# Patient Record
Sex: Male | Born: 1935
Health system: Southern US, Community
[De-identification: ages and names within clinical notes are randomized; demographics above are authoritative.]

## PROBLEM LIST (undated history)

## (undated) DIAGNOSIS — M109 Gout, unspecified: Secondary | ICD-10-CM

## (undated) DIAGNOSIS — I10 Essential (primary) hypertension: Secondary | ICD-10-CM

## (undated) DIAGNOSIS — I2510 Atherosclerotic heart disease of native coronary artery without angina pectoris: Secondary | ICD-10-CM

## (undated) DIAGNOSIS — K219 Gastro-esophageal reflux disease without esophagitis: Secondary | ICD-10-CM

## (undated) DIAGNOSIS — M419 Scoliosis, unspecified: Secondary | ICD-10-CM

## (undated) DIAGNOSIS — E079 Disorder of thyroid, unspecified: Secondary | ICD-10-CM

## (undated) DIAGNOSIS — I739 Peripheral vascular disease, unspecified: Secondary | ICD-10-CM

## (undated) DIAGNOSIS — J449 Chronic obstructive pulmonary disease, unspecified: Secondary | ICD-10-CM

## (undated) DIAGNOSIS — K5792 Diverticulitis of intestine, part unspecified, without perforation or abscess without bleeding: Secondary | ICD-10-CM

## (undated) DIAGNOSIS — M1990 Unspecified osteoarthritis, unspecified site: Secondary | ICD-10-CM

## (undated) DIAGNOSIS — C801 Malignant (primary) neoplasm, unspecified: Secondary | ICD-10-CM

## (undated) DIAGNOSIS — D649 Anemia, unspecified: Secondary | ICD-10-CM

## (undated) DIAGNOSIS — N289 Disorder of kidney and ureter, unspecified: Secondary | ICD-10-CM

## (undated) DIAGNOSIS — M4800 Spinal stenosis, site unspecified: Secondary | ICD-10-CM

## (undated) DIAGNOSIS — I509 Heart failure, unspecified: Secondary | ICD-10-CM

## (undated) DIAGNOSIS — Z992 Dependence on renal dialysis: Secondary | ICD-10-CM

## (undated) HISTORY — PX: CARDIAC SURGERY: SHX584

## (undated) HISTORY — PX: CARDIAC DEFIBRILLATOR PLACEMENT: SHX171

---

## 2016-07-04 ENCOUNTER — Emergency Department (HOSPITAL_BASED_OUTPATIENT_CLINIC_OR_DEPARTMENT_OTHER)
Admission: EM | Admit: 2016-07-04 | Discharge: 2016-07-04 | Disposition: A | Discharge status: Home / Self Care | Payer: Medicare HMO | Attending: Emergency Medicine | Admitting: Emergency Medicine

## 2016-07-04 ENCOUNTER — Emergency Department (HOSPITAL_BASED_OUTPATIENT_CLINIC_OR_DEPARTMENT_OTHER): Payer: Medicare HMO

## 2016-07-04 ENCOUNTER — Encounter (HOSPITAL_BASED_OUTPATIENT_CLINIC_OR_DEPARTMENT_OTHER): Payer: Self-pay | Admitting: *Deleted

## 2016-07-04 DIAGNOSIS — R05 Cough: Secondary | ICD-10-CM | POA: Diagnosis present

## 2016-07-04 DIAGNOSIS — Z792 Long term (current) use of antibiotics: Secondary | ICD-10-CM | POA: Diagnosis not present

## 2016-07-04 DIAGNOSIS — N186 End stage renal disease: Secondary | ICD-10-CM | POA: Diagnosis not present

## 2016-07-04 DIAGNOSIS — J189 Pneumonia, unspecified organism: Secondary | ICD-10-CM | POA: Insufficient documentation

## 2016-07-04 DIAGNOSIS — Z87891 Personal history of nicotine dependence: Secondary | ICD-10-CM | POA: Diagnosis not present

## 2016-07-04 DIAGNOSIS — J449 Chronic obstructive pulmonary disease, unspecified: Secondary | ICD-10-CM | POA: Diagnosis not present

## 2016-07-04 DIAGNOSIS — I120 Hypertensive chronic kidney disease with stage 5 chronic kidney disease or end stage renal disease: Secondary | ICD-10-CM | POA: Insufficient documentation

## 2016-07-04 DIAGNOSIS — Z992 Dependence on renal dialysis: Secondary | ICD-10-CM | POA: Insufficient documentation

## 2016-07-04 HISTORY — DX: Disorder of kidney and ureter, unspecified: N28.9

## 2016-07-04 HISTORY — DX: Scoliosis, unspecified: M41.9

## 2016-07-04 HISTORY — DX: Chronic obstructive pulmonary disease, unspecified: J44.9

## 2016-07-04 HISTORY — DX: Anemia, unspecified: D64.9

## 2016-07-04 HISTORY — DX: Diverticulitis of intestine, part unspecified, without perforation or abscess without bleeding: K57.92

## 2016-07-04 HISTORY — DX: Heart failure, unspecified: I50.9

## 2016-07-04 HISTORY — DX: Spinal stenosis, site unspecified: M48.00

## 2016-07-04 HISTORY — DX: Dependence on renal dialysis: Z99.2

## 2016-07-04 HISTORY — DX: Gout, unspecified: M10.9

## 2016-07-04 HISTORY — DX: Gastro-esophageal reflux disease without esophagitis: K21.9

## 2016-07-04 HISTORY — DX: Essential (primary) hypertension: I10

## 2016-07-04 HISTORY — DX: Malignant (primary) neoplasm, unspecified: C80.1

## 2016-07-04 HISTORY — DX: Peripheral vascular disease, unspecified: I73.9

## 2016-07-04 HISTORY — DX: Disorder of thyroid, unspecified: E07.9

## 2016-07-04 HISTORY — DX: Unspecified osteoarthritis, unspecified site: M19.90

## 2016-07-04 HISTORY — DX: Atherosclerotic heart disease of native coronary artery without angina pectoris: I25.10

## 2016-07-04 LAB — CBC WITH DIFFERENTIAL/PLATELET
BASOS ABS: 0 10*3/uL (ref 0.0–0.1)
Basophils Relative: 0 %
EOS ABS: 1.5 10*3/uL — AB (ref 0.0–0.7)
Eosinophils Relative: 24 %
HEMATOCRIT: 36.5 % — AB (ref 39.0–52.0)
HEMOGLOBIN: 11.7 g/dL — AB (ref 13.0–17.0)
LYMPHS PCT: 9 %
Lymphs Abs: 0.6 10*3/uL — ABNORMAL LOW (ref 0.7–4.0)
MCH: 29.2 pg (ref 26.0–34.0)
MCHC: 32.1 g/dL (ref 30.0–36.0)
MCV: 91 fL (ref 78.0–100.0)
MONOS PCT: 6 %
Monocytes Absolute: 0.4 10*3/uL (ref 0.1–1.0)
NEUTROS ABS: 3.9 10*3/uL (ref 1.7–7.7)
NEUTROS PCT: 61 %
Platelets: 100 10*3/uL — ABNORMAL LOW (ref 150–400)
RBC: 4.01 MIL/uL — AB (ref 4.22–5.81)
RDW: 14.9 % (ref 11.5–15.5)
WBC: 6.4 10*3/uL (ref 4.0–10.5)

## 2016-07-04 LAB — BASIC METABOLIC PANEL
ANION GAP: 7 (ref 5–15)
BUN: 11 mg/dL (ref 6–20)
CALCIUM: 8.5 mg/dL — AB (ref 8.9–10.3)
CHLORIDE: 100 mmol/L — AB (ref 101–111)
CO2: 30 mmol/L (ref 22–32)
CREATININE: 3.04 mg/dL — AB (ref 0.61–1.24)
GFR calc non Af Amer: 18 mL/min — ABNORMAL LOW (ref >60)
GFR, EST AFRICAN AMERICAN: 21 mL/min — AB (ref >60)
Glucose, Bld: 114 mg/dL — ABNORMAL HIGH (ref 65–99)
Potassium: 3.4 mmol/L — ABNORMAL LOW (ref 3.5–5.1)
SODIUM: 137 mmol/L (ref 135–145)

## 2016-07-04 LAB — TROPONIN I: TROPONIN I: 0.1 ng/mL — AB (ref <0.03)

## 2016-07-04 LAB — I-STAT CG4 LACTIC ACID, ED: LACTIC ACID, VENOUS: 1.23 mmol/L (ref 0.5–1.9)

## 2016-07-04 MED ORDER — LEVOFLOXACIN 500 MG PO TABS: 500.0000 mg | ORAL_TABLET | ORAL | 0 refills | Status: AC

## 2016-07-04 MED ORDER — LEVOFLOXACIN 750 MG PO TABS
750.0000 mg | ORAL_TABLET | Freq: Once | ORAL | Status: AC
  Administered 2016-07-04: 750 mg via ORAL
  Filled 2016-07-04: qty 1

## 2016-07-04 MED ORDER — IPRATROPIUM BROMIDE 0.02 % IN SOLN
0.5000 mg | Freq: Once | RESPIRATORY_TRACT | Status: AC
  Administered 2016-07-04: 0.5 mg via RESPIRATORY_TRACT
  Filled 2016-07-04: qty 2.5

## 2016-07-04 MED ORDER — ALBUTEROL SULFATE (2.5 MG/3ML) 0.083% IN NEBU
5.0000 mg | INHALATION_SOLUTION | Freq: Once | RESPIRATORY_TRACT | Status: AC
  Administered 2016-07-04: 5 mg via RESPIRATORY_TRACT
  Filled 2016-07-04: qty 6

## 2016-07-04 MED FILL — levoFLOXacin 500 MG TABS: 500 | 14 days supply | Qty: 7 | Fill #0

## 2016-07-04 NOTE — ED Triage Notes (Signed)
Cough with green sputum for a week.

## 2016-07-04 NOTE — ED Provider Notes (Addendum)
[facility] DEPT [facility] Provider Note   CSN: [id number] Arrival date & time: 07/04/16  1124  First Provider Contact:  First MD Initiated Contact with Patient 07/04/16 1129        History   Chief Complaint Chief Complaint  Patient presents with  . Cough    HPI Micheal Aguilar is a 82 y.o. male.  The history is provided by the patient and the spouse.  Cough  This is a new problem. Episode onset: Proximally 1 week. The problem occurs constantly. The problem has not changed since onset.The cough is productive of blood-tinged sputum and productive of purulent sputum. There has been no fever. Associated symptoms include rhinorrhea, myalgias, shortness of breath and wheezing. Pertinent negatives include no chest pain. Associated symptoms comments: Decreased appetite and chest tightness. Treatments tried: Albuterol inhaler. The treatment provided no relief. He is not a smoker. His past medical history is significant for COPD. Past medical history comments: End-stage renal disease on dialysis last dialyzed today.    Past Medical History:  Diagnosis Date  . COPD (chronic obstructive pulmonary disease) ([place])   . Dialysis patient ([place])   . Hypertension     There are no active problems to display for this patient.   History reviewed. No pertinent surgical history.     Home Medications    Prior to Admission medications   Medication Sig Start Date End Date Taking? Authorizing Provider  clindamycin (CLEOCIN) 150 MG capsule Take by mouth 3 (three) times daily.   Yes Historical Provider, MD  digoxin (LANOXIN) 0.125 MG tablet Take 0.125 mg by mouth as directed. ONE TABLET ON Monday, Wednesday, AND Friday.   Yes Historical Provider, MD    Family History No family history on file.  Social History Social History  Substance Use Topics  . Smoking status: Former Smoker    Types: Cigarettes  . Smokeless tobacco: Never Used  . Alcohol use No     Allergies   Review of patient's  allergies indicates no known allergies.   Review of Systems Review of Systems  HENT: Positive for rhinorrhea.   Respiratory: Positive for cough, shortness of breath and wheezing.   Cardiovascular: Negative for chest pain.  Musculoskeletal: Positive for myalgias.     Physical Exam Updated Vital Signs BP 134/68 (BP Location: Right Arm)   Pulse 62   Temp 97.5 F (36.4 C) (Oral)   Resp 18   Ht 5\' 6"  (1.676 m)   Wt 115 lb (52.2 kg)   SpO2 100%   BMI 18.56 kg/m   Physical Exam  Constitutional: He is oriented to person, place, and time. He appears well-developed. He appears cachectic. No distress.  HENT:  Head: Normocephalic and atraumatic.  Mouth/Throat: Oropharynx is clear and moist.  Eyes: Conjunctivae and EOM are normal. Pupils are equal, round, and reactive to light.  Neck: Normal range of motion. Neck supple.  Cardiovascular: Normal rate, regular rhythm and intact distal pulses.   No murmur heard. Pulmonary/Chest: Effort normal. No respiratory distress. He has wheezes in the right upper field, the right middle field and the right lower field. He has no rales.  Pacemaker present in the left upper chest  Abdominal: Soft. He exhibits no distension. There is no tenderness. There is no rebound and no guarding.  Musculoskeletal: Normal range of motion. He exhibits no edema or tenderness.  Neurological: He is alert and oriented to person, place, and time.  Skin: Skin is warm and dry. No rash noted. No erythema.  Psychiatric:  He has a normal mood and affect. His behavior is normal.  Nursing note and vitals reviewed.    ED Treatments / Results  Labs (all labs ordered are listed, but only abnormal results are displayed) Labs Reviewed  CBC WITH DIFFERENTIAL/PLATELET - Abnormal; Notable for the following:       Result Value   RBC 4.01 (*)    Hemoglobin 11.7 (*)    HCT 36.5 (*)    Platelets 100 (*)    Lymphs Abs 0.6 (*)    Eosinophils Absolute 1.5 (*)    All other  components within normal limits  BASIC METABOLIC PANEL - Abnormal; Notable for the following:    Potassium 3.4 (*)    Chloride 100 (*)    Glucose, Bld 114 (*)    Creatinine, Ser 3.04 (*)    Calcium 8.5 (*)    GFR calc non Af Amer 18 (*)    GFR calc Af Amer 21 (*)    All other components within normal limits  TROPONIN I - Abnormal; Notable for the following:    Troponin I 0.10 (*)    All other components within normal limits  I-STAT CG4 LACTIC ACID, ED    EKG  EKG Interpretation  Date/Time:  Thursday July 04 2016 11:51:02 EDT Ventricular Rate:  61 PR Interval:    QRS Duration: 99 QT Interval:  449 QTC Calculation: 453 R Axis:   34 Text Interpretation:  Sinus rhythm Probable LVH with secondary repol abnrm No previous tracing Confirmed by Maryan Rued  MD, Radiah Lubinski ([id number]) on 07/04/2016 11:58:01 AM       Radiology Dg Chest 2 View  Result Date: 07/04/2016 CLINICAL DATA:  Shortness of breath, wheezing, cough. EXAM: CHEST  2 VIEW COMPARISON:  07/18/2015 FINDINGS: Left single lead pacer tip is in the right atrium. Cardiomegaly. This is stable since prior study. Right perihilar and basilar airspace opacity with small right effusion. Left lung is clear. No acute bony abnormality. IMPRESSION: Right perihilar and basilar airspace opacities. Cannot exclude pneumonia. Small right effusion. Stable cardiomegaly. Electronically Signed   By: Rolm Baptise M.D.   On: 07/04/2016 12:21   Procedures Procedures (including critical care time)  Medications Ordered in ED Medications  albuterol (PROVENTIL) (2.5 MG/3ML) 0.083% nebulizer solution 5 mg (not administered)  ipratropium (ATROVENT) nebulizer solution 0.5 mg (not administered)     Initial Impression / Assessment and Plan / ED Course  I have reviewed the triage vital signs and the nursing notes.  Pertinent labs & imaging results that were available during my care of the patient were reviewed by me and considered in my medical decision making  (see chart for details).  Clinical Course   Patient is a 82 year old male with multiple medical problems presenting today with one week of persistent URI symptoms including a productive cough, decreased appetite and shortness of breath. On exam patient is cachectic without signs of fluid overload at this time. He completed dialysis this morning and came here for further evaluation. He has wheezing in the right upper and lower lobes with an otherwise normal exam. He has no significant lower extremity edema and his weights at dialysis have been stable. Concern for bronchitis versus pneumonia. Lower suspicion for CHF for ACS. All temp pending. CBC, BMP, troponin, lactate, EKG, chest x-ray pending. Patient given albuterol and Atrovent.  1:50 PM Chest x-ray concerning for a right-sided pneumonia which is also clinically for patients has abnormal breath sounds. CBC within normal limits, lactate within normal limits and  BMP with normal potassium. Patient's troponin is mildly elevated however this is most likely a troponin leak as he has chronic cardiomyopathy and is on dialysis. Discussed with family the results and may wish to try oral antibiotics at home and return if symptoms worsen. Will treat patient with Levaquin renally dosed and follow-up with his doctor on Monday or return sooner if symptoms persist. Final Clinical Impressions(s) / ED Diagnoses   Final diagnoses:  Community acquired pneumonia    New Prescriptions New Prescriptions   LEVOFLOXACIN (LEVAQUIN) 500 MG TABLET    Take 1 tablet (500 mg total) by mouth every other day.     Blanchie Dessert, MD 07/04/16 1354    Blanchie Dessert, MD 07/04/16 1354

## 2016-11-29 ENCOUNTER — Emergency Department (HOSPITAL_BASED_OUTPATIENT_CLINIC_OR_DEPARTMENT_OTHER): Payer: Medicare HMO

## 2016-11-29 ENCOUNTER — Encounter (HOSPITAL_BASED_OUTPATIENT_CLINIC_OR_DEPARTMENT_OTHER): Payer: Self-pay | Admitting: Emergency Medicine

## 2016-11-29 ENCOUNTER — Emergency Department (HOSPITAL_BASED_OUTPATIENT_CLINIC_OR_DEPARTMENT_OTHER)
Admission: EM | Admit: 2016-11-29 | Discharge: 2016-11-29 | Disposition: A | Discharge status: Home / Self Care | Payer: Medicare HMO | Attending: Emergency Medicine | Admitting: Emergency Medicine

## 2016-11-29 DIAGNOSIS — Z992 Dependence on renal dialysis: Secondary | ICD-10-CM | POA: Diagnosis not present

## 2016-11-29 DIAGNOSIS — Z95810 Presence of automatic (implantable) cardiac defibrillator: Secondary | ICD-10-CM | POA: Insufficient documentation

## 2016-11-29 DIAGNOSIS — I110 Hypertensive heart disease with heart failure: Secondary | ICD-10-CM | POA: Insufficient documentation

## 2016-11-29 DIAGNOSIS — R0602 Shortness of breath: Secondary | ICD-10-CM | POA: Diagnosis present

## 2016-11-29 DIAGNOSIS — J441 Chronic obstructive pulmonary disease with (acute) exacerbation: Secondary | ICD-10-CM

## 2016-11-29 DIAGNOSIS — C801 Malignant (primary) neoplasm, unspecified: Secondary | ICD-10-CM | POA: Insufficient documentation

## 2016-11-29 DIAGNOSIS — Z87891 Personal history of nicotine dependence: Secondary | ICD-10-CM | POA: Insufficient documentation

## 2016-11-29 DIAGNOSIS — I509 Heart failure, unspecified: Secondary | ICD-10-CM | POA: Insufficient documentation

## 2016-11-29 DIAGNOSIS — I2510 Atherosclerotic heart disease of native coronary artery without angina pectoris: Secondary | ICD-10-CM | POA: Insufficient documentation

## 2016-11-29 LAB — COMPREHENSIVE METABOLIC PANEL
ALBUMIN: 3.2 g/dL — AB (ref 3.5–5.0)
ALT: 10 U/L — AB (ref 17–63)
AST: 21 U/L (ref 15–41)
Alkaline Phosphatase: 75 U/L (ref 38–126)
Anion gap: 9 (ref 5–15)
BUN: 17 mg/dL (ref 6–20)
CHLORIDE: 104 mmol/L (ref 101–111)
CO2: 26 mmol/L (ref 22–32)
CREATININE: 4.28 mg/dL — AB (ref 0.61–1.24)
Calcium: 8.4 mg/dL — ABNORMAL LOW (ref 8.9–10.3)
GFR calc non Af Amer: 12 mL/min — ABNORMAL LOW (ref >60)
GFR, EST AFRICAN AMERICAN: 14 mL/min — AB (ref >60)
GLUCOSE: 134 mg/dL — AB (ref 65–99)
Potassium: 4.2 mmol/L (ref 3.5–5.1)
SODIUM: 139 mmol/L (ref 135–145)
Total Bilirubin: 0.7 mg/dL (ref 0.3–1.2)
Total Protein: 8.4 g/dL — ABNORMAL HIGH (ref 6.5–8.1)

## 2016-11-29 LAB — DIGOXIN LEVEL: Digoxin Level: <0.2 ng/mL — ABNORMAL LOW (ref 0.8–2.0)

## 2016-11-29 LAB — CBC WITH DIFFERENTIAL/PLATELET
BASOS ABS: 0 10*3/uL (ref 0.0–0.1)
BASOS PCT: 0 %
EOS ABS: 1.7 10*3/uL — AB (ref 0.0–0.7)
EOS PCT: 23 %
HCT: 41 % (ref 39.0–52.0)
Hemoglobin: 12.9 g/dL — ABNORMAL LOW (ref 13.0–17.0)
Lymphocytes Relative: 9 %
Lymphs Abs: 0.6 10*3/uL — ABNORMAL LOW (ref 0.7–4.0)
MCH: 30.9 pg (ref 26.0–34.0)
MCHC: 31.5 g/dL (ref 30.0–36.0)
MCV: 98.1 fL (ref 78.0–100.0)
Monocytes Absolute: 0.2 10*3/uL (ref 0.1–1.0)
Monocytes Relative: 3 %
NEUTROS PCT: 65 %
Neutro Abs: 4.9 10*3/uL (ref 1.7–7.7)
PLATELETS: 104 10*3/uL — AB (ref 150–400)
RBC: 4.18 MIL/uL — AB (ref 4.22–5.81)
RDW: 13.9 % (ref 11.5–15.5)
WBC: 7.5 10*3/uL (ref 4.0–10.5)

## 2016-11-29 LAB — I-STAT CG4 LACTIC ACID, ED: LACTIC ACID, VENOUS: 1.77 mmol/L (ref 0.5–1.9)

## 2016-11-29 LAB — BRAIN NATRIURETIC PEPTIDE: B NATRIURETIC PEPTIDE 5: 3980 pg/mL — AB (ref 0.0–100.0)

## 2016-11-29 LAB — TROPONIN I
TROPONIN I: 0.07 ng/mL — AB (ref <0.03)
Troponin I: 0.07 ng/mL (ref <0.03)

## 2016-11-29 MED ORDER — ALBUTEROL SULFATE (2.5 MG/3ML) 0.083% IN NEBU
5.0000 mg | INHALATION_SOLUTION | Freq: Once | RESPIRATORY_TRACT | Status: AC
  Administered 2016-11-29: 5 mg via RESPIRATORY_TRACT
  Filled 2016-11-29: qty 6

## 2016-11-29 MED ORDER — ALBUTEROL SULFATE (2.5 MG/3ML) 0.083% IN NEBU
INHALATION_SOLUTION | RESPIRATORY_TRACT | Status: AC
  Administered 2016-11-29: 2.5 mg
  Filled 2016-11-29: qty 3

## 2016-11-29 MED ORDER — METHYLPREDNISOLONE SODIUM SUCC 125 MG IJ SOLR
125.0000 mg | Freq: Once | INTRAMUSCULAR | Status: AC
  Administered 2016-11-29: 125 mg via INTRAVENOUS
  Filled 2016-11-29: qty 2

## 2016-11-29 MED ORDER — IPRATROPIUM-ALBUTEROL 0.5-2.5 (3) MG/3ML IN SOLN
RESPIRATORY_TRACT | Status: AC
  Administered 2016-11-29: 3 mL
  Filled 2016-11-29: qty 3

## 2016-11-29 MED ORDER — IPRATROPIUM BROMIDE 0.02 % IN SOLN: 0.5000 mg | Freq: Once | RESPIRATORY_TRACT | Status: DC

## 2016-11-29 MED ORDER — ALBUTEROL SULFATE (2.5 MG/3ML) 0.083% IN NEBU: 5.0000 mg | INHALATION_SOLUTION | Freq: Once | RESPIRATORY_TRACT | Status: DC

## 2016-11-29 MED ORDER — PREDNISONE 10 MG PO TABS: ORAL_TABLET | ORAL | 0 refills | Status: DC

## 2016-11-29 MED FILL — predniSONE 10 MG TABS: 10 | 6 days supply | Qty: 24 | Fill #0

## 2016-11-29 NOTE — ED Notes (Signed)
Patient transported to X-ray 

## 2016-11-29 NOTE — ED Triage Notes (Signed)
Pt having increasing sob since last Thursday.  Pt on home O2.  Pt using accessory muscles.  Respirations labored with rest and exertion.  Pt has prroductive cough,  White sputum.

## 2016-11-29 NOTE — ED Provider Notes (Signed)
[facility] DEPT [facility] Provider Note   CSN: [id number] Arrival date & time: 11/29/16  [id number]     History   Chief Complaint Chief Complaint  Patient presents with  . Shortness of Breath    HPI Micheal Aguilar is a 82 y.o. male hx of anemia, CHF, COPD on 2 L [place], ESRD on HD (last HD yesterday), Here presenting with shortness of breath. Patient has been short of breath and coughing since yesterday. Patient is on 2 L nasal cannula at baseline but needed to increase his oxygen to 3 L yesterday. Has productive cough with whitish sputum. Patient denies any fevers. Patient finished his dialysis yesterday. Patient has a history of COPD. Last admission was several years ago.   The history is provided by the patient.    Past Medical History:  Diagnosis Date  . Anemia   . Arthritis   . Cancer ([place])   . CHF (congestive heart failure) ([place])   . COPD (chronic obstructive pulmonary disease) ([place])   . Coronary artery disease   . Dialysis patient ([place])   . Diverticulitis   . GERD (gastroesophageal reflux disease)   . Gout   . Hypertension   . PVD (peripheral vascular disease) ([place])   . Renal disorder   . Scoliosis   . Spinal stenosis   . Thyroid disease     Patient Active Problem List   Diagnosis Date Noted  . Cancer [facility])     Past Surgical History:  Procedure Laterality Date  . CARDIAC DEFIBRILLATOR PLACEMENT    . CARDIAC SURGERY         Home Medications    Prior to Admission medications   Medication Sig Start Date End Date Taking? Authorizing Provider  carvedilol (COREG) 25 MG tablet Take 25 mg by mouth 2 (two) times daily with a meal.    Historical Provider, MD  Chlorpheniramine-Acetaminophen (CORICIDIN HBP COLD/FLU PO) Take by mouth.    Historical Provider, MD  clindamycin (CLEOCIN) 150 MG capsule Take by mouth 3 (three) times daily.    Historical Provider, MD  digoxin (LANOXIN) 0.125 MG tablet Take 0.125 mg by mouth as directed. ONE TABLET ON Monday, Wednesday, AND  Friday.    Historical Provider, MD  isosorbide dinitrate (ISORDIL) 10 MG tablet Take 10 mg by mouth 3 (three) times daily.    Historical Provider, MD  levofloxacin (LEVAQUIN) 500 MG tablet Take 1 tablet (500 mg total) by mouth every other day. 07/04/16   Blanchie Dessert, MD  lisinopril (PRINIVIL,ZESTRIL) 20 MG tablet Take 20 mg by mouth daily.    Historical Provider, MD  NIFEdipine (PROCARDIA XL/ADALAT-CC) 90 MG 24 hr tablet Take 90 mg by mouth daily.    Historical Provider, MD  omeprazole (PRILOSEC) 40 MG capsule Take 40 mg by mouth daily.    Historical Provider, MD  Oxycodone HCl 10 MG TABS Take 10 mg by mouth 4 (four) times daily as needed.    Historical Provider, MD  Pancrelipase, Lip-Prot-Amyl, (ZENPEP) 20000 units CPEP Take 1 capsule by mouth 3 (three) times daily.    Historical Provider, MD  pentoxifylline (TRENTAL) 400 MG CR tablet Take 400 mg by mouth 3 (three) times daily with meals.    Historical Provider, MD  sevelamer carbonate (RENVELA) 2.4 g PACK 2.4 g 2 (two) times daily with a meal.    Historical Provider, MD  sevelamer carbonate (RENVELA) 800 MG tablet Take 800 mg by mouth 3 (three) times daily with meals.    Historical Provider, MD  Family History No family history on file.  Social History Social History  Substance Use Topics  . Smoking status: Former Smoker    Types: Cigarettes  . Smokeless tobacco: Never Used  . Alcohol use No     Allergies   Patient has no known allergies.   Review of Systems Review of Systems  Respiratory: Positive for shortness of breath.   All other systems reviewed and are negative.    Physical Exam Updated Vital Signs BP 119/65   Pulse 61   Resp 16   SpO2 93%   Physical Exam  Constitutional:  Chronically ill, tachypneic   HENT:  Head: Normocephalic.  Mouth/Throat: Oropharynx is clear and moist.  Eyes: EOM are normal. Pupils are equal, round, and reactive to light.  Neck: Normal range of motion. Neck supple.    Cardiovascular: Regular rhythm and normal heart sounds.   Mildly tachy   Pulmonary/Chest:  + tachypneic, + wheezing throughout. Some accessory muscle use   Abdominal: Soft. Bowel sounds are normal. He exhibits no distension. There is no tenderness.  Musculoskeletal: Normal range of motion.  Neurological: He is alert.  Skin: Skin is warm.  Psychiatric: He has a normal mood and affect.  Nursing note and vitals reviewed.    ED Treatments / Results  Labs (all labs ordered are listed, but only abnormal results are displayed) Labs Reviewed  CBC WITH DIFFERENTIAL/PLATELET - Abnormal; Notable for the following:       Result Value   RBC 4.18 (*)    Hemoglobin 12.9 (*)    Platelets 104 (*)    Lymphs Abs 0.6 (*)    Eosinophils Absolute 1.7 (*)    All other components within normal limits  COMPREHENSIVE METABOLIC PANEL - Abnormal; Notable for the following:    Glucose, Bld 134 (*)    Creatinine, Ser 4.28 (*)    Calcium 8.4 (*)    Total Protein 8.4 (*)    Albumin 3.2 (*)    ALT 10 (*)    GFR calc non Af Amer 12 (*)    GFR calc Af Amer 14 (*)    All other components within normal limits  TROPONIN I - Abnormal; Notable for the following:    Troponin I 0.07 (*)    All other components within normal limits  DIGOXIN LEVEL - Abnormal; Notable for the following:    Digoxin Level <0.2 (*)    All other components within normal limits  BRAIN NATRIURETIC PEPTIDE - Abnormal; Notable for the following:    B Natriuretic Peptide 3,980.0 (*)    All other components within normal limits  TROPONIN I - Abnormal; Notable for the following:    Troponin I 0.07 (*)    All other components within normal limits  CULTURE, BLOOD (ROUTINE X 2)  CULTURE, BLOOD (ROUTINE X 2)  I-STAT CG4 LACTIC ACID, ED    EKG  EKG Interpretation  Date/Time:  Friday November 29 2016 09:05:36 EST Ventricular Rate:  84 PR Interval:    QRS Duration: 99 QT Interval:  399 QTC Calculation: 472 R Axis:   1 Text  Interpretation:  Sinus rhythm Atrial premature complex Left atrial enlargement Borderline low voltage, extremity leads LVH with secondary repolarization abnormality Anterior Q waves, possibly due to LVH ST depr, consider ischemia, inferior leads Baseline wander in lead(s) V6 poor baseline, grossly unchanged  Confirmed by YAO  MD, DAVID ([id number]) on 11/29/2016 9:09:10 AM Also confirmed by Darl Householder  MD, DAVID ([id number]), editor [place]  CCT, BEVERLY (  [id number])  on 11/29/2016 10:43:55 AM       Radiology Dg Chest 2 View  Result Date: 11/29/2016 CLINICAL DATA:  Cough and wheezing. EXAM: CHEST  2 VIEW COMPARISON:  08/16/2016 FINDINGS: There is a left chest wall ICD with lead in the right ventricle. Moderate to marked cardiac enlargement noted. No pleural effusion or edema. No airspace opacities. IMPRESSION: 1. No acute cardiopulmonary abnormalities. 2. Cardiac enlargement. Electronically Signed   By: Kerby Moors M.D.   On: 11/29/2016 09:53    Procedures Procedures (including critical care time)  Medications Ordered in ED Medications  albuterol (PROVENTIL) (2.5 MG/3ML) 0.083% nebulizer solution 5 mg (5 mg Nebulization Not Given 11/29/16 0932)  ipratropium (ATROVENT) nebulizer solution 0.5 mg (0.5 mg Nebulization Not Given 11/29/16 0932)  methylPREDNISolone sodium succinate (SOLU-MEDROL) 125 mg/2 mL injection 125 mg (125 mg Intravenous Given 11/29/16 0926)  albuterol (PROVENTIL) (2.5 MG/3ML) 0.083% nebulizer solution (2.5 mg  Given 11/29/16 0933)  ipratropium-albuterol (DUONEB) 0.5-2.5 (3) MG/3ML nebulizer solution (3 mLs  Given 11/29/16 0933)  albuterol (PROVENTIL) (2.5 MG/3ML) 0.083% nebulizer solution 5 mg (5 mg Nebulization Given 11/29/16 1007)     Initial Impression / Assessment and Plan / ED Course  I have reviewed the triage vital signs and the nursing notes.  Pertinent labs & imaging results that were available during my care of the patient were reviewed by me and considered in my medical  decision making (see chart for details).  Clinical Course     Sachin Gassner is a 82 y.o. male here with cough, wheezing. Likely COPD vs CHF vs pneumonia. Will get labs, lactate, culture, CXR. Will give nebs, steroids and reassess.   1:39 PM' After nebs, steroids, patient ambulated well on oxygen. Less wheezing now and felt subjectively much better. Trop was 0.07, delta was the same. I doubt ACS, likely chronic troponin leak in the setting of dialysis. BNP 4000 but CXR showed no pulmonary edema and patient not on diuretics. Will dc home with steroids. Appears slightly volume overloaded so may be able to get off more fluid during dialysis tomorrow.    Final Clinical Impressions(s) / ED Diagnoses   Final diagnoses:  Cancer [facility])    New Prescriptions New Prescriptions   No medications on file     Drenda Freeze, MD 11/29/16 1341

## 2016-11-29 NOTE — ED Notes (Signed)
Ambulated in hall on 2l/m [place], minimal assist. SpO2 98-100%, some SHOB noted when back in room. VS updated once back to room.

## 2016-11-29 NOTE — ED Notes (Signed)
Pt directed to pharmacy to pick up Rx. D/c home with sig other. Pt alert, states he feels better

## 2016-11-29 NOTE — ED Notes (Signed)
ED Provider at bedside. 

## 2016-11-29 NOTE — ED Notes (Signed)
Witnessed pt walking in dept with RT monitoring pulse ox. Pt using home O2 and cane. Pt states" I feel great"

## 2016-11-29 NOTE — Discharge Instructions (Signed)
Take prednisone as prescribed.   Use albuterol every 4 hrs as needed for shortness of breath.   You have some fluid in the lungs, please mention this to your dialysis doctor tomorrow. You may need more fluid taken off.   Return to ER if you have worse shortness of breath, chest pain, fever, trouble breathing, vomiting.

## 2016-12-03 LAB — BLOOD CULTURE ID PANEL (REFLEXED)
Acinetobacter baumannii: NOT DETECTED
CANDIDA GLABRATA: NOT DETECTED
CANDIDA KRUSEI: NOT DETECTED
CANDIDA PARAPSILOSIS: NOT DETECTED
CANDIDA TROPICALIS: NOT DETECTED
Candida albicans: NOT DETECTED
ENTEROBACTER CLOACAE COMPLEX: NOT DETECTED
Enterobacteriaceae species: NOT DETECTED
Enterococcus species: NOT DETECTED
Escherichia coli: NOT DETECTED
Haemophilus influenzae: NOT DETECTED
KLEBSIELLA PNEUMONIAE: NOT DETECTED
Klebsiella oxytoca: NOT DETECTED
Listeria monocytogenes: NOT DETECTED
Neisseria meningitidis: NOT DETECTED
PSEUDOMONAS AERUGINOSA: NOT DETECTED
Proteus species: NOT DETECTED
SERRATIA MARCESCENS: NOT DETECTED
STAPHYLOCOCCUS AUREUS BCID: NOT DETECTED
STAPHYLOCOCCUS SPECIES: NOT DETECTED
STREPTOCOCCUS PNEUMONIAE: NOT DETECTED
Streptococcus agalactiae: NOT DETECTED
Streptococcus pyogenes: NOT DETECTED
Streptococcus species: NOT DETECTED

## 2016-12-03 NOTE — ED Notes (Addendum)
12/03/16 19:15: Call received from [place] in [facility] lab that blood culture from 12/22 is positive for gram + cocci. No organism ID available.  Chart given to EDP K[place] to review

## 2016-12-03 NOTE — ED Notes (Signed)
Called pt (2 pt identifiers confirmed) and made pt aware of positive blood culture. Pt is to follow up with his PMD as soon as possible per Dr. Laneta Simmers. Pt and wife verbalized understanding.

## 2016-12-04 LAB — CULTURE, BLOOD (ROUTINE X 2): Culture: NO GROWTH

## 2016-12-05 LAB — CULTURE, BLOOD (ROUTINE X 2)

## 2016-12-06 ENCOUNTER — Telehealth (HOSPITAL_BASED_OUTPATIENT_CLINIC_OR_DEPARTMENT_OTHER): Payer: Self-pay

## 2016-12-06 NOTE — Telephone Encounter (Signed)
Post ED Visit - Positive Culture Follow-up  Culture report reviewed by antimicrobial stewardship pharmacist:  []  Elenor Quinones, Pharm.D. []  Heide Guile, Pharm.D., BCPS []  Parks Neptune, Pharm.D. []  Alycia Rossetti, Pharm.D., BCPS []  [place], Pharm.D., BCPS, AAHIVP []  [place]grand [place], Pharm.D., BCPS, AAHIVP []  Milus Glazier, Pharm.D. []  Rob Garner, Pharm.DAbram Sander, Pharm.D.  Blood cx -> Staph species  Possible contaminant  ED called pt OK  Dortha Kern 12/06/2016, 5:53 PM

## 2017-01-20 ENCOUNTER — Emergency Department (HOSPITAL_BASED_OUTPATIENT_CLINIC_OR_DEPARTMENT_OTHER)
Admission: EM | Admit: 2017-01-20 | Discharge: 2017-01-20 | Disposition: A | Discharge status: Home / Self Care | Payer: Medicare HMO | Attending: Emergency Medicine | Admitting: Emergency Medicine

## 2017-01-20 ENCOUNTER — Emergency Department (HOSPITAL_BASED_OUTPATIENT_CLINIC_OR_DEPARTMENT_OTHER): Payer: Medicare HMO

## 2017-01-20 ENCOUNTER — Encounter (HOSPITAL_BASED_OUTPATIENT_CLINIC_OR_DEPARTMENT_OTHER): Payer: Self-pay

## 2017-01-20 DIAGNOSIS — I2510 Atherosclerotic heart disease of native coronary artery without angina pectoris: Secondary | ICD-10-CM | POA: Insufficient documentation

## 2017-01-20 DIAGNOSIS — J441 Chronic obstructive pulmonary disease with (acute) exacerbation: Secondary | ICD-10-CM | POA: Diagnosis not present

## 2017-01-20 DIAGNOSIS — R05 Cough: Secondary | ICD-10-CM | POA: Diagnosis present

## 2017-01-20 DIAGNOSIS — Z87891 Personal history of nicotine dependence: Secondary | ICD-10-CM | POA: Insufficient documentation

## 2017-01-20 DIAGNOSIS — I110 Hypertensive heart disease with heart failure: Secondary | ICD-10-CM | POA: Insufficient documentation

## 2017-01-20 DIAGNOSIS — I509 Heart failure, unspecified: Secondary | ICD-10-CM | POA: Insufficient documentation

## 2017-01-20 DIAGNOSIS — Z992 Dependence on renal dialysis: Secondary | ICD-10-CM | POA: Diagnosis not present

## 2017-01-20 LAB — CBC WITH DIFFERENTIAL/PLATELET
Basophils Absolute: 0 10*3/uL (ref 0.0–0.1)
Basophils Relative: 0 %
Eosinophils Absolute: 0.2 10*3/uL (ref 0.0–0.7)
Eosinophils Relative: 4 %
HCT: 41 % (ref 39.0–52.0)
Hemoglobin: 13 g/dL (ref 13.0–17.0)
Lymphocytes Relative: 13 %
Lymphs Abs: 0.7 10*3/uL (ref 0.7–4.0)
MCH: 30.1 pg (ref 26.0–34.0)
MCHC: 31.7 g/dL (ref 30.0–36.0)
MCV: 94.9 fL (ref 78.0–100.0)
Monocytes Absolute: 0.6 10*3/uL (ref 0.1–1.0)
Monocytes Relative: 12 %
Neutro Abs: 3.5 10*3/uL (ref 1.7–7.7)
Neutrophils Relative %: 71 %
Platelets: 125 10*3/uL — ABNORMAL LOW (ref 150–400)
RBC: 4.32 MIL/uL (ref 4.22–5.81)
RDW: 14 % (ref 11.5–15.5)
WBC: 4.9 10*3/uL (ref 4.0–10.5)

## 2017-01-20 LAB — BASIC METABOLIC PANEL
Anion gap: 11 (ref 5–15)
BUN: 38 mg/dL — ABNORMAL HIGH (ref 6–20)
CO2: 25 mmol/L (ref 22–32)
Calcium: 8.3 mg/dL — ABNORMAL LOW (ref 8.9–10.3)
Chloride: 103 mmol/L (ref 101–111)
Creatinine, Ser: 6.13 mg/dL — ABNORMAL HIGH (ref 0.61–1.24)
GFR calc Af Amer: 9 mL/min — ABNORMAL LOW (ref >60)
GFR calc non Af Amer: 8 mL/min — ABNORMAL LOW (ref >60)
Glucose, Bld: 119 mg/dL — ABNORMAL HIGH (ref 65–99)
Potassium: 4.7 mmol/L (ref 3.5–5.1)
Sodium: 139 mmol/L (ref 135–145)

## 2017-01-20 MED ORDER — ALBUTEROL SULFATE (2.5 MG/3ML) 0.083% IN NEBU
2.5000 mg | INHALATION_SOLUTION | Freq: Once | RESPIRATORY_TRACT | Status: AC
  Administered 2017-01-20: 2.5 mg via RESPIRATORY_TRACT
  Filled 2017-01-20: qty 3

## 2017-01-20 MED ORDER — ALBUTEROL (5 MG/ML) CONTINUOUS INHALATION SOLN
INHALATION_SOLUTION | RESPIRATORY_TRACT | Status: AC
  Administered 2017-01-20: 15 mg/h via RESPIRATORY_TRACT
  Filled 2017-01-20: qty 20

## 2017-01-20 MED ORDER — PREDNISONE 50 MG PO TABS: 50.0000 mg | ORAL_TABLET | Freq: Every day | ORAL | 0 refills | Status: DC

## 2017-01-20 MED ORDER — IPRATROPIUM-ALBUTEROL 0.5-2.5 (3) MG/3ML IN SOLN
3.0000 mL | Freq: Once | RESPIRATORY_TRACT | Status: AC
  Administered 2017-01-20: 3 mL via RESPIRATORY_TRACT
  Filled 2017-01-20: qty 3

## 2017-01-20 MED ORDER — METHYLPREDNISOLONE SODIUM SUCC 125 MG IJ SOLR
125.0000 mg | Freq: Once | INTRAMUSCULAR | Status: AC
  Administered 2017-01-20: 125 mg via INTRAVENOUS
  Filled 2017-01-20: qty 2

## 2017-01-20 MED ORDER — ALBUTEROL (5 MG/ML) CONTINUOUS INHALATION SOLN
15.0000 mg/h | INHALATION_SOLUTION | RESPIRATORY_TRACT | Status: AC
  Administered 2017-01-20: 15 mg/h via RESPIRATORY_TRACT

## 2017-01-20 MED FILL — predniSONE 50 MG TABS: 50 | 5 days supply | Qty: 5 | Fill #0

## 2017-01-20 NOTE — ED Triage Notes (Signed)
Patient here with increased cough and congestion since Friday. States having some shortness of breath with exertion. coughing throughout assessment. Has been using nebulizer at home with minimal relief. Denies fever, no chest pain, non-smoker. Wheezing noted.

## 2017-01-20 NOTE — Discharge Instructions (Signed)
Return here as needed. Follow up with your PCP. Your condition may worsen so you would need to return to the hospital

## 2017-01-20 NOTE — ED Notes (Signed)
SpO2 87% on room air, no SOB noted. Placed on 2l/m [place]

## 2017-01-20 NOTE — ED Notes (Signed)
Patient ambulated in hall with walker and 2l/m [place] O2.  SpO2 lowest 90%.  Patient denies DOE, VS updated after returned to run, BBS unchanged.

## 2017-01-21 NOTE — ED Provider Notes (Signed)
[facility] DEPT [facility] Provider Note   CSN: [id number] Arrival date & time: 01/20/17  [id number]     History   Chief Complaint Chief Complaint  Patient presents with  . Cough    HPI Micheal Aguilar is a 83 y.o. male.  HPI Patient presents to the emergency department with increasing cough and shortness of breath since Friday.  The patient states that his home nebulizer treatments have not been effective.  He states that nothing seems make his condition, better by exertion makes his symptoms worse.  The patient states that he is on home oxygen.  He states that he has not had any other medications other than his prescribed medication. The patient denies chest pain, headache,blurred vision, neck pain, fever, cough, weakness, numbness, dizziness, anorexia, edema, abdominal pain, nausea, vomiting, diarrhea, rash, back pain, dysuria, hematemesis, bloody stool, near syncope, or syncope. Past Medical History:  Diagnosis Date  . Anemia   . Arthritis   . Cancer ([place])   . CHF (congestive heart failure) ([place])   . COPD (chronic obstructive pulmonary disease) ([place])   . Coronary artery disease   . Dialysis patient ([place])   . Diverticulitis   . GERD (gastroesophageal reflux disease)   . Gout   . Hypertension   . PVD (peripheral vascular disease) ([place])   . Renal disorder   . Scoliosis   . Spinal stenosis   . Thyroid disease     Patient Active Problem List   Diagnosis Date Noted  . Cancer [facility])     Past Surgical History:  Procedure Laterality Date  . CARDIAC DEFIBRILLATOR PLACEMENT    . CARDIAC SURGERY         Home Medications    Prior to Admission medications   Medication Sig Start Date End Date Taking? Authorizing Provider  carvedilol (COREG) 25 MG tablet Take 25 mg by mouth 2 (two) times daily with a meal.    Historical Provider, MD  Chlorpheniramine-Acetaminophen (CORICIDIN HBP COLD/FLU PO) Take by mouth.    Historical Provider, MD  clindamycin (CLEOCIN) 150 MG capsule Take by  mouth 3 (three) times daily.    Historical Provider, MD  digoxin (LANOXIN) 0.125 MG tablet Take 0.125 mg by mouth as directed. ONE TABLET ON Monday, Wednesday, AND Friday.    Historical Provider, MD  isosorbide dinitrate (ISORDIL) 10 MG tablet Take 10 mg by mouth 3 (three) times daily.    Historical Provider, MD  levofloxacin (LEVAQUIN) 500 MG tablet Take 1 tablet (500 mg total) by mouth every other day. 07/04/16   Blanchie Dessert, MD  lisinopril (PRINIVIL,ZESTRIL) 20 MG tablet Take 20 mg by mouth daily.    Historical Provider, MD  NIFEdipine (PROCARDIA XL/ADALAT-CC) 90 MG 24 hr tablet Take 90 mg by mouth daily.    Historical Provider, MD  omeprazole (PRILOSEC) 40 MG capsule Take 40 mg by mouth daily.    Historical Provider, MD  Oxycodone HCl 10 MG TABS Take 10 mg by mouth 4 (four) times daily as needed.    Historical Provider, MD  Pancrelipase, Lip-Prot-Amyl, (ZENPEP) 20000 units CPEP Take 1 capsule by mouth 3 (three) times daily.    Historical Provider, MD  pentoxifylline (TRENTAL) 400 MG CR tablet Take 400 mg by mouth 3 (three) times daily with meals.    Historical Provider, MD  predniSONE (DELTASONE) 50 MG tablet Take 1 tablet (50 mg total) by mouth daily. 01/20/17   Dalia Heading, PA-C  sevelamer carbonate (RENVELA) 2.4 g PACK 2.4 g 2 (two) times daily with  a meal.    Historical Provider, MD  sevelamer carbonate (RENVELA) 800 MG tablet Take 800 mg by mouth 3 (three) times daily with meals.    Historical Provider, MD    Family History No family history on file.  Social History Social History  Substance Use Topics  . Smoking status: Former Smoker    Types: Cigarettes  . Smokeless tobacco: Never Used  . Alcohol use No     Allergies   Patient has no known allergies.   Review of Systems Review of Systems All other systems negative except as documented in the HPI. All pertinent positives and negatives as reviewed in the HPI.  Physical Exam Updated Vital Signs BP 136/69    Pulse 90   Temp 97.9 F (36.6 C) (Oral)   Resp 19   SpO2 100%   Physical Exam  Constitutional: He is oriented to person, place, and time. He appears well-developed and well-nourished. No distress.  HENT:  Head: Normocephalic and atraumatic.  Mouth/Throat: Oropharynx is clear and moist.  Eyes: Pupils are equal, round, and reactive to light.  Neck: Normal range of motion. Neck supple.  Cardiovascular: Normal rate, regular rhythm and normal heart sounds.  Exam reveals no gallop and no friction rub.   No murmur heard. Pulmonary/Chest: He is in respiratory distress. He has wheezes. He has rhonchi. He has no rales. He exhibits no tenderness.  Abdominal: Soft. Bowel sounds are normal. He exhibits no distension. There is no tenderness.  Neurological: He is alert and oriented to person, place, and time. He exhibits normal muscle tone. Coordination normal.  Skin: Skin is warm and dry. No rash noted. No erythema.  Psychiatric: He has a normal mood and affect. His behavior is normal.  Nursing note and vitals reviewed.    ED Treatments / Results  Labs (all labs ordered are listed, but only abnormal results are displayed) Labs Reviewed  BASIC METABOLIC PANEL - Abnormal; Notable for the following:       Result Value   Glucose, Bld 119 (*)    BUN 38 (*)    Creatinine, Ser 6.13 (*)    Calcium 8.3 (*)    GFR calc non Af Amer 8 (*)    GFR calc Af Amer 9 (*)    All other components within normal limits  CBC WITH DIFFERENTIAL/PLATELET - Abnormal; Notable for the following:    Platelets 125 (*)    All other components within normal limits    EKG  EKG Interpretation None       Radiology Dg Chest 2 View  Result Date: 01/20/2017 CLINICAL DATA:  Cough and congestion. EXAM: CHEST  2 VIEW COMPARISON:  11/29/2016. FINDINGS: Cardiac pacer noted with lead tip projected over the right ventricle. Cardiomegaly with normal pulmonary vascularity. No focal pulmonary infiltrate. Interstitial prominence  noted bilaterally, these changes are chronic. Tiny bilateral pleural effusions and/or pleural scarring noted. No pneumothorax. Left subclavian stents noted in unchanged position. IMPRESSION: 1. Cardiac pacer with lead tip projected over the right ventricle. Stable cardiomegaly. No evidence of overt congestive heart failure. 2. Chronic interstitial changes. Tiny bilateral pleural effusions and/or pleural scarring. Electronically Signed   By: Marcello Moores  Register   On: 01/20/2017 09:48    Procedures Procedures (including critical care time)  Medications Ordered in ED Medications  albuterol (PROVENTIL,VENTOLIN) solution continuous neb (0 mg/hr Nebulization Stopped 01/20/17 1127)  ipratropium-albuterol (DUONEB) 0.5-2.5 (3) MG/3ML nebulizer solution 3 mL (3 mLs Nebulization Given 01/20/17 0916)  albuterol (PROVENTIL) (2.5 MG/3ML) 0.083%  nebulizer solution 2.5 mg (2.5 mg Nebulization Given 01/20/17 0916)  methylPREDNISolone sodium succinate (SOLU-MEDROL) 125 mg/2 mL injection 125 mg (125 mg Intravenous Given 01/20/17 0957)     Initial Impression / Assessment and Plan / ED Course  I have reviewed the triage vital signs and the nursing notes.  Pertinent labs & imaging results that were available during my care of the patient were reviewed by me and considered in my medical decision making (see chart for details).     I advised the patient that he would need toBe admitted based on the fact that he has significant past medical history along with his age and the fact that he does have some respiratory distress. Patient states he does not want to be admitted to the hospital and advised that he would rather follow with his primary doctor and pulmonologist.  The patient did ambulate in the hallway and was able to maintain.  Patient is advised to return here for any worsening in his condition.  Patient agrees the plan and all questions were answered  Final Clinical Impressions(s) / ED Diagnoses   Final diagnoses:   COPD exacerbation [facility])    New Prescriptions Discharge Medication List as of 01/20/2017  1:26 PM       Dalia Heading, PA-C 01/27/17 [place], DO 01/29/17 [phone]

## 2018-04-19 ENCOUNTER — Encounter (HOSPITAL_BASED_OUTPATIENT_CLINIC_OR_DEPARTMENT_OTHER): Payer: Self-pay | Admitting: Emergency Medicine

## 2018-04-19 ENCOUNTER — Emergency Department (HOSPITAL_BASED_OUTPATIENT_CLINIC_OR_DEPARTMENT_OTHER): Payer: Medicare HMO

## 2018-04-19 ENCOUNTER — Emergency Department (HOSPITAL_BASED_OUTPATIENT_CLINIC_OR_DEPARTMENT_OTHER)
Admission: EM | Admit: 2018-04-19 | Discharge: 2018-04-19 | Disposition: A | Discharge status: Home / Self Care | Payer: Medicare HMO | Attending: Emergency Medicine | Admitting: Emergency Medicine

## 2018-04-19 ENCOUNTER — Other Ambulatory Visit: Payer: Self-pay

## 2018-04-19 DIAGNOSIS — I2510 Atherosclerotic heart disease of native coronary artery without angina pectoris: Secondary | ICD-10-CM | POA: Insufficient documentation

## 2018-04-19 DIAGNOSIS — Z87891 Personal history of nicotine dependence: Secondary | ICD-10-CM | POA: Diagnosis not present

## 2018-04-19 DIAGNOSIS — R42 Dizziness and giddiness: Secondary | ICD-10-CM | POA: Insufficient documentation

## 2018-04-19 DIAGNOSIS — N186 End stage renal disease: Secondary | ICD-10-CM | POA: Insufficient documentation

## 2018-04-19 DIAGNOSIS — J449 Chronic obstructive pulmonary disease, unspecified: Secondary | ICD-10-CM | POA: Insufficient documentation

## 2018-04-19 DIAGNOSIS — I132 Hypertensive heart and chronic kidney disease with heart failure and with stage 5 chronic kidney disease, or end stage renal disease: Secondary | ICD-10-CM | POA: Insufficient documentation

## 2018-04-19 DIAGNOSIS — Z992 Dependence on renal dialysis: Secondary | ICD-10-CM | POA: Insufficient documentation

## 2018-04-19 DIAGNOSIS — Z79899 Other long term (current) drug therapy: Secondary | ICD-10-CM | POA: Diagnosis not present

## 2018-04-19 DIAGNOSIS — I509 Heart failure, unspecified: Secondary | ICD-10-CM | POA: Diagnosis not present

## 2018-04-19 LAB — CBC WITH DIFFERENTIAL/PLATELET
Basophils Absolute: 0 10*3/uL (ref 0.0–0.1)
Basophils Relative: 0 %
EOS PCT: 9 %
Eosinophils Absolute: 0.5 10*3/uL (ref 0.0–0.7)
HCT: 37.5 % — ABNORMAL LOW (ref 39.0–52.0)
HEMOGLOBIN: 11.9 g/dL — AB (ref 13.0–17.0)
LYMPHS ABS: 0.9 10*3/uL (ref 0.7–4.0)
Lymphocytes Relative: 16 %
MCH: 29.2 pg (ref 26.0–34.0)
MCHC: 31.7 g/dL (ref 30.0–36.0)
MCV: 92.1 fL (ref 78.0–100.0)
MONOS PCT: 5 %
Monocytes Absolute: 0.3 10*3/uL (ref 0.1–1.0)
Neutro Abs: 3.7 10*3/uL (ref 1.7–7.7)
Neutrophils Relative %: 70 %
Platelets: 74 10*3/uL — ABNORMAL LOW (ref 150–400)
RBC: 4.07 MIL/uL — AB (ref 4.22–5.81)
RDW: 16.1 % — ABNORMAL HIGH (ref 11.5–15.5)
WBC: 5.4 10*3/uL (ref 4.0–10.5)

## 2018-04-19 LAB — COMPREHENSIVE METABOLIC PANEL
ALBUMIN: 2.7 g/dL — AB (ref 3.5–5.0)
ALT: 16 U/L — AB (ref 17–63)
AST: 24 U/L (ref 15–41)
Alkaline Phosphatase: 83 U/L (ref 38–126)
Anion gap: 7 (ref 5–15)
BUN: 15 mg/dL (ref 6–20)
CHLORIDE: 106 mmol/L (ref 101–111)
CO2: 24 mmol/L (ref 22–32)
Calcium: 7.4 mg/dL — ABNORMAL LOW (ref 8.9–10.3)
Creatinine, Ser: 4.02 mg/dL — ABNORMAL HIGH (ref 0.61–1.24)
GFR calc non Af Amer: 13 mL/min — ABNORMAL LOW (ref >60)
GFR, EST AFRICAN AMERICAN: 15 mL/min — AB (ref >60)
GLUCOSE: 85 mg/dL (ref 65–99)
Potassium: 3.3 mmol/L — ABNORMAL LOW (ref 3.5–5.1)
SODIUM: 137 mmol/L (ref 135–145)
Total Bilirubin: 0.6 mg/dL (ref 0.3–1.2)
Total Protein: 7.1 g/dL (ref 6.5–8.1)

## 2018-04-19 LAB — TROPONIN I: Troponin I: 0.09 ng/mL (ref <0.03)

## 2018-04-19 NOTE — ED Notes (Addendum)
Pt alert, speech clear, answers questions appropriately. Reports he felt dizzy upon waking this morning. Denies dizziness at present but states he gets dizzy when he stands up. Grips equal, no drift, facial symmetry present. Last dialysis was yesterday.

## 2018-04-19 NOTE — ED Notes (Addendum)
Pt refused chest xray. Dr. Alvino Chapel made aware.

## 2018-04-19 NOTE — Discharge Instructions (Addendum)
Hold the Neurontin for now.  Encourage oral intake.  Follow-up with your doctors.

## 2018-04-19 NOTE — ED Notes (Signed)
Date and time results received: 04/19/18 1819  Test:trop Critical Value:0.09 Name of Provider Notified: Alvino Chapel Orders Received? Or Actions Taken?: no orders given

## 2018-04-19 NOTE — ED Notes (Signed)
Pt and FM given d/c instructions as per chart. Verbalizes understanding. No questions. 

## 2018-04-19 NOTE — ED Notes (Signed)
Patient transported to X-ray 

## 2018-04-19 NOTE — ED Triage Notes (Signed)
Patient states that he became acutely dizzy this am  When he stood up. The patient denies any SOB or Chest pain. He reports that he is having some generalizes weakness

## 2018-04-19 NOTE — ED Notes (Signed)
ICD interrogated and faxed results given to EDP

## 2018-04-20 NOTE — ED Provider Notes (Signed)
[place] EMERGENCY DEPARTMENT Provider Note   CSN: [id number] Arrival date & time: 04/19/18  1549     History   Chief Complaint Chief Complaint  Patient presents with  . Dizziness    HPI Micheal Aguilar is a 84 y.o. male.  HPI Patient presents with dizziness.  States that when he woke up this morning he felt generally weak and felt like he could have difficulty standing.  States he felt somewhat lightheaded.  Also felt as if things were moving potentially but felt more like he was going to pass out.  Last dialyzed yesterday and is recently started dialysis.  No chest pain.  No trouble breathing.  Also yesterday started Neurontin and has had a single dose.  Has not been on this medication before.  Does have a history of somewhat low blood pressure particularly after dialysis.  No fevers or chills.  No chest pain.  No dysuria.  Patient has an AICD. Past Medical History:  Diagnosis Date  . Anemia   . Arthritis   . Cancer ([place])   . CHF (congestive heart failure) ([place])   . COPD (chronic obstructive pulmonary disease) ([place])   . Coronary artery disease   . Dialysis patient ([place])   . Diverticulitis   . GERD (gastroesophageal reflux disease)   . Gout   . Hypertension   . PVD (peripheral vascular disease) ([place])   . Renal disorder   . Scoliosis   . Spinal stenosis   . Thyroid disease     Patient Active Problem List   Diagnosis Date Noted  . Cancer [facility])     Past Surgical History:  Procedure Laterality Date  . CARDIAC DEFIBRILLATOR PLACEMENT    . CARDIAC SURGERY          Home Medications    Prior to Admission medications   Medication Sig Start Date End Date Taking? Authorizing Provider  carvedilol (COREG) 25 MG tablet Take 25 mg by mouth 2 (two) times daily with a meal.    [provider]  Chlorpheniramine-Acetaminophen (CORICIDIN HBP COLD/FLU PO) Take by mouth.    [provider]  clindamycin (CLEOCIN) 150 MG capsule Take by mouth 3  (three) times daily.    [provider]  digoxin (LANOXIN) 0.125 MG tablet Take 0.125 mg by mouth as directed. ONE TABLET ON Monday, Wednesday, AND Friday.    [provider]  isosorbide dinitrate (ISORDIL) 10 MG tablet Take 10 mg by mouth 3 (three) times daily.    [provider]  levofloxacin (LEVAQUIN) 500 MG tablet Take 1 tablet (500 mg total) by mouth every other day. 07/04/16   Blanchie Dessert, MD  lisinopril (PRINIVIL,ZESTRIL) 20 MG tablet Take 20 mg by mouth daily.    [provider]  NIFEdipine (PROCARDIA XL/ADALAT-CC) 90 MG 24 hr tablet Take 90 mg by mouth daily.    [provider]  omeprazole (PRILOSEC) 40 MG capsule Take 40 mg by mouth daily.    [provider]  Oxycodone HCl 10 MG TABS Take 10 mg by mouth 4 (four) times daily as needed.    [provider]  Pancrelipase, Lip-Prot-Amyl, (ZENPEP) 20000 units CPEP Take 1 capsule by mouth 3 (three) times daily.    [provider]  pentoxifylline (TRENTAL) 400 MG CR tablet Take 400 mg by mouth 3 (three) times daily with meals.    [provider]  predniSONE (DELTASONE) 50 MG tablet Take 1 tablet (50 mg total) by mouth daily. 01/20/17  Lawyer, Harrell Gave, PA-C  sevelamer carbonate (RENVELA) 2.4 g PACK 2.4 g 2 (two) times daily with a meal.    [provider]  sevelamer carbonate (RENVELA) 800 MG tablet Take 800 mg by mouth 3 (three) times daily with meals.    [provider]    Family History History reviewed. No pertinent family history.  Social History Social History   Tobacco Use  . Smoking status: Former Smoker    Types: Cigarettes  . Smokeless tobacco: Never Used  Substance Use Topics  . Alcohol use: No  . Drug use: No     Allergies   Patient has no known allergies.   Review of Systems Review of Systems  Constitutional: Negative for appetite change and fever.  HENT: Negative for congestion.   Respiratory: Negative for  shortness of breath.   Cardiovascular: Negative for chest pain.  Gastrointestinal: Negative for abdominal pain.  Endocrine: Negative for polyuria.  Genitourinary: Negative for flank pain.  Musculoskeletal: Negative for back pain.  Skin: Negative for rash.  Neurological: Positive for light-headedness. Negative for tremors.  Psychiatric/Behavioral: Negative for confusion.     Physical Exam Updated Vital Signs BP 99/73 (BP Location: Left Arm)   Pulse 75   Temp 97.7 F (36.5 C) (Oral)   Resp (!) 25   Ht 5\' 6"  (1.676 m)   Wt 54.4 kg (120 lb)   SpO2 90%   BMI 19.37 kg/m   Physical Exam  Constitutional: He appears well-developed.  HENT:  Head: Atraumatic.  Neck: Neck supple.  Cardiovascular: Normal rate.  Pulmonary/Chest: Effort normal.  Abdominal: Soft.  Musculoskeletal: He exhibits no edema.  Difficulty with straight leg raise on right side.  States this is chronic.  Neurological: He is alert.  No nystagmus.  Finger-nose intact bilaterally.  Skin: Capillary refill takes less than 2 seconds. No erythema.     ED Treatments / Results  Labs (all labs ordered are listed, but only abnormal results are displayed) Labs Reviewed  COMPREHENSIVE METABOLIC PANEL - Abnormal; Notable for the following components:      Result Value   Potassium 3.3 (*)    Creatinine, Ser 4.02 (*)    Calcium 7.4 (*)    Albumin 2.7 (*)    ALT 16 (*)    GFR calc non Af Amer 13 (*)    GFR calc Af Amer 15 (*)    All other components within normal limits  TROPONIN I - Abnormal; Notable for the following components:   Troponin I 0.09 (*)    All other components within normal limits  CBC WITH DIFFERENTIAL/PLATELET - Abnormal; Notable for the following components:   RBC 4.07 (*)    Hemoglobin 11.9 (*)    HCT 37.5 (*)    RDW 16.1 (*)    Platelets 74 (*)    All other components within normal limits    EKG EKG Interpretation  Date/Time:  Sunday Apr 19 2018 16:09:16 EDT Ventricular Rate:  82 PR  Interval:    QRS Duration: 99 QT Interval:  395 QTC Calculation: 462 R Axis:   40 Text Interpretation:  Sinus rhythm Paired ventricular premature complexes Probable left atrial enlargement Low voltage, extremity leads Anteroseptal infarct, old Abnormal T, consider ischemia, lateral leads Confirmed by Davonna Belling [phone]) on 04/19/2018 4:54:20 PM   Radiology No results found.  Procedures Procedures (including critical care time)  Medications Ordered in ED Medications - No data to display   Initial Impression / Assessment and Plan / ED Course  I have reviewed the triage vital signs and the nursing notes.  Pertinent labs & imaging results that were available during my care of the patient were reviewed by me and considered in my medical decision making (see chart for details).     Patient with dizziness.  Feels better.  Blood pressure improved.  Not orthostatic.  Mild hypocalcemia mild hypokalemia.  Troponin mildly elevated but this appears to be his baseline.  AICD interrogated and showed no abnormalities today but it has 31 episodes of nonsustained V. tach in the last couple months.  However none of them were this morning.  Patient feels better.  Will discharge home.  Will stop the Neurontin.  Follow-up with his PCP.  Also follow with his cardiologist.  Final Clinical Impressions(s) / ED Diagnoses   Final diagnoses:  [place]    ED Discharge Orders    None       Davonna Belling, MD 04/20/18 [phone]

## 2018-06-08 ENCOUNTER — Emergency Department (HOSPITAL_BASED_OUTPATIENT_CLINIC_OR_DEPARTMENT_OTHER)
Admission: EM | Admit: 2018-06-08 | Discharge: 2018-06-08 | Disposition: A | Discharge status: Home / Self Care | Payer: Medicare HMO | Attending: Emergency Medicine | Admitting: Emergency Medicine

## 2018-06-08 ENCOUNTER — Other Ambulatory Visit: Payer: Self-pay

## 2018-06-08 ENCOUNTER — Emergency Department (HOSPITAL_BASED_OUTPATIENT_CLINIC_OR_DEPARTMENT_OTHER): Payer: Medicare HMO

## 2018-06-08 ENCOUNTER — Encounter (HOSPITAL_BASED_OUTPATIENT_CLINIC_OR_DEPARTMENT_OTHER): Payer: Self-pay | Admitting: Emergency Medicine

## 2018-06-08 DIAGNOSIS — Z87891 Personal history of nicotine dependence: Secondary | ICD-10-CM | POA: Diagnosis not present

## 2018-06-08 DIAGNOSIS — J181 Lobar pneumonia, unspecified organism: Secondary | ICD-10-CM | POA: Diagnosis not present

## 2018-06-08 DIAGNOSIS — I2510 Atherosclerotic heart disease of native coronary artery without angina pectoris: Secondary | ICD-10-CM | POA: Insufficient documentation

## 2018-06-08 DIAGNOSIS — R7989 Other specified abnormal findings of blood chemistry: Secondary | ICD-10-CM | POA: Insufficient documentation

## 2018-06-08 DIAGNOSIS — Z992 Dependence on renal dialysis: Secondary | ICD-10-CM | POA: Insufficient documentation

## 2018-06-08 DIAGNOSIS — N186 End stage renal disease: Secondary | ICD-10-CM | POA: Insufficient documentation

## 2018-06-08 DIAGNOSIS — Z79899 Other long term (current) drug therapy: Secondary | ICD-10-CM | POA: Insufficient documentation

## 2018-06-08 DIAGNOSIS — I509 Heart failure, unspecified: Secondary | ICD-10-CM | POA: Insufficient documentation

## 2018-06-08 DIAGNOSIS — I132 Hypertensive heart and chronic kidney disease with heart failure and with stage 5 chronic kidney disease, or end stage renal disease: Secondary | ICD-10-CM | POA: Insufficient documentation

## 2018-06-08 DIAGNOSIS — R0602 Shortness of breath: Secondary | ICD-10-CM | POA: Diagnosis present

## 2018-06-08 DIAGNOSIS — J441 Chronic obstructive pulmonary disease with (acute) exacerbation: Secondary | ICD-10-CM | POA: Insufficient documentation

## 2018-06-08 DIAGNOSIS — J189 Pneumonia, unspecified organism: Secondary | ICD-10-CM

## 2018-06-08 DIAGNOSIS — R778 Other specified abnormalities of plasma proteins: Secondary | ICD-10-CM

## 2018-06-08 LAB — CBC WITH DIFFERENTIAL/PLATELET
BASOS ABS: 0 10*3/uL (ref 0.0–0.1)
Basophils Relative: 0 %
EOS ABS: 0.3 10*3/uL (ref 0.0–0.7)
Eosinophils Relative: 5 %
HEMATOCRIT: 40.6 % (ref 39.0–52.0)
Hemoglobin: 12.3 g/dL — ABNORMAL LOW (ref 13.0–17.0)
LYMPHS ABS: 0.8 10*3/uL (ref 0.7–4.0)
Lymphocytes Relative: 14 %
MCH: 28.1 pg (ref 26.0–34.0)
MCHC: 30.3 g/dL (ref 30.0–36.0)
MCV: 92.9 fL (ref 78.0–100.0)
MONOS PCT: 7 %
Monocytes Absolute: 0.4 10*3/uL (ref 0.1–1.0)
NEUTROS ABS: 4.2 10*3/uL (ref 1.7–7.7)
Neutrophils Relative %: 74 %
Platelets: 86 10*3/uL — ABNORMAL LOW (ref 150–400)
RBC: 4.37 MIL/uL (ref 4.22–5.81)
RDW: 16.9 % — AB (ref 11.5–15.5)
Smear Review: DECREASED
WBC: 5.7 10*3/uL (ref 4.0–10.5)

## 2018-06-08 LAB — I-STAT ARTERIAL BLOOD GAS, ED
Acid-base deficit: 4 mmol/L — ABNORMAL HIGH (ref 0.0–2.0)
Bicarbonate: 22.6 mmol/L (ref 20.0–28.0)
O2 SAT: 80 %
TCO2: 24 mmol/L (ref 22–32)
pCO2 arterial: 45.9 mmHg (ref 32.0–48.0)
pH, Arterial: 7.3 — ABNORMAL LOW (ref 7.350–7.450)
pO2, Arterial: 49 mmHg — ABNORMAL LOW (ref 83.0–108.0)

## 2018-06-08 LAB — COMPREHENSIVE METABOLIC PANEL
ALBUMIN: 2.6 g/dL — AB (ref 3.5–5.0)
ALT: 18 U/L (ref 0–44)
ANION GAP: 9 (ref 5–15)
AST: 22 U/L (ref 15–41)
Alkaline Phosphatase: 107 U/L (ref 38–126)
BILIRUBIN TOTAL: 0.7 mg/dL (ref 0.3–1.2)
BUN: 15 mg/dL (ref 8–23)
CO2: 25 mmol/L (ref 22–32)
Calcium: 7.7 mg/dL — ABNORMAL LOW (ref 8.9–10.3)
Chloride: 107 mmol/L (ref 98–111)
Creatinine, Ser: 4.87 mg/dL — ABNORMAL HIGH (ref 0.61–1.24)
GFR calc Af Amer: 12 mL/min — ABNORMAL LOW (ref >60)
GFR, EST NON AFRICAN AMERICAN: 10 mL/min — AB (ref >60)
Glucose, Bld: 84 mg/dL (ref 70–99)
POTASSIUM: 3.2 mmol/L — AB (ref 3.5–5.1)
Sodium: 141 mmol/L (ref 135–145)
TOTAL PROTEIN: 7.3 g/dL (ref 6.5–8.1)

## 2018-06-08 LAB — TROPONIN I
TROPONIN I: 0.08 ng/mL — AB (ref <0.03)
Troponin I: 0.08 ng/mL (ref <0.03)

## 2018-06-08 LAB — BRAIN NATRIURETIC PEPTIDE: B NATRIURETIC PEPTIDE 5: 2576.8 pg/mL — AB (ref 0.0–100.0)

## 2018-06-08 LAB — DIGOXIN LEVEL: Digoxin Level: <0.2 ng/mL — ABNORMAL LOW (ref 0.8–2.0)

## 2018-06-08 MED ORDER — DOXYCYCLINE HYCLATE 100 MG PO CAPS: 100.0000 mg | ORAL_CAPSULE | Freq: Two times a day (BID) | ORAL | 0 refills | Status: AC

## 2018-06-08 MED ORDER — DOXYCYCLINE HYCLATE 100 MG PO TABS
100.0000 mg | ORAL_TABLET | Freq: Once | ORAL | Status: AC
  Administered 2018-06-08: 100 mg via ORAL
  Filled 2018-06-08: qty 1

## 2018-06-08 MED ORDER — IPRATROPIUM-ALBUTEROL 0.5-2.5 (3) MG/3ML IN SOLN
3.0000 mL | Freq: Once | RESPIRATORY_TRACT | Status: AC
  Administered 2018-06-08: 3 mL via RESPIRATORY_TRACT
  Filled 2018-06-08: qty 3

## 2018-06-08 MED ORDER — PREDNISONE 20 MG PO TABS: ORAL_TABLET | ORAL | 0 refills | Status: AC

## 2018-06-08 MED ORDER — METHYLPREDNISOLONE SODIUM SUCC 125 MG IJ SOLR
125.0000 mg | Freq: Once | INTRAMUSCULAR | Status: AC
  Administered 2018-06-08: 125 mg via INTRAVENOUS
  Filled 2018-06-08: qty 2

## 2018-06-08 MED ORDER — ALBUTEROL SULFATE (2.5 MG/3ML) 0.083% IN NEBU
2.5000 mg | INHALATION_SOLUTION | Freq: Once | RESPIRATORY_TRACT | Status: AC
  Administered 2018-06-08: 2.5 mg via RESPIRATORY_TRACT
  Filled 2018-06-08: qty 3

## 2018-06-08 MED ORDER — IPRATROPIUM BROMIDE 0.02 % IN SOLN
0.5000 mg | Freq: Once | RESPIRATORY_TRACT | Status: AC
  Administered 2018-06-08: 0.5 mg via RESPIRATORY_TRACT
  Filled 2018-06-08: qty 2.5

## 2018-06-08 MED ORDER — ALBUTEROL SULFATE (2.5 MG/3ML) 0.083% IN NEBU
5.0000 mg | INHALATION_SOLUTION | Freq: Once | RESPIRATORY_TRACT | Status: AC
  Administered 2018-06-08: 5 mg via RESPIRATORY_TRACT
  Filled 2018-06-08: qty 6

## 2018-06-08 MED FILL — DOXYCYCLINE HYCLATE 100 MG: 100 | 7 days supply | Qty: 14 | Fill #0

## 2018-06-08 MED FILL — predniSONE 20 MG TABS: 20 | 6 days supply | Qty: 12 | Fill #0

## 2018-06-08 NOTE — ED Provider Notes (Addendum)
[facility] EMERGENCY DEPARTMENT Provider Note   CSN: [id number] Arrival date & time: 06/08/18  0813     History   Chief Complaint Chief Complaint  Patient presents with  . Shortness of Breath    HPI Micheal Aguilar is a 84 y.o. male hx of CHF, CAD, HTN, ESRD on HD (last HD 2 days ago), here presenting with cough, shortness of breath.  Patient states that he has been having some cough since yesterday.  Initially was productive but this morning he states that nothing is coming up.  He has some subjective shortness of breath as well.  Uses albuterol twice yesterday but none this morning.  Patient states that his legs has been more swollen than usual.  Patient denies any chest pain currently.  Patient states that he is due for dialysis tomorrow. States that he has hx of CHF and COPD and doesn't smoke currently.   The history is provided by the spouse and the patient.    Past Medical History:  Diagnosis Date  . Anemia   . Arthritis   . Cancer ([place])   . CHF (congestive heart failure) ([place])   . COPD (chronic obstructive pulmonary disease) ([place])   . Coronary artery disease   . Dialysis patient ([place])   . Diverticulitis   . GERD (gastroesophageal reflux disease)   . Gout   . Hypertension   . PVD (peripheral vascular disease) ([place])   . Renal disorder   . Scoliosis   . Spinal stenosis   . Thyroid disease     Patient Active Problem List   Diagnosis Date Noted  . Cancer [facility])     Past Surgical History:  Procedure Laterality Date  . CARDIAC DEFIBRILLATOR PLACEMENT    . CARDIAC SURGERY          Home Medications    Prior to Admission medications   Medication Sig Start Date End Date Taking? Authorizing Provider  carvedilol (COREG) 25 MG tablet Take 25 mg by mouth 2 (two) times daily with a meal.    [provider]  Chlorpheniramine-Acetaminophen (CORICIDIN HBP COLD/FLU PO) Take by mouth.    [provider]  clindamycin (CLEOCIN) 150 MG capsule  Take by mouth 3 (three) times daily.    [provider]  digoxin (LANOXIN) 0.125 MG tablet Take 0.125 mg by mouth as directed. ONE TABLET ON Monday, Wednesday, AND Friday.    [provider]  isosorbide dinitrate (ISORDIL) 10 MG tablet Take 10 mg by mouth 3 (three) times daily.    [provider]  levofloxacin (LEVAQUIN) 500 MG tablet Take 1 tablet (500 mg total) by mouth every other day. 07/04/16   Blanchie Dessert, MD  lisinopril (PRINIVIL,ZESTRIL) 20 MG tablet Take 20 mg by mouth daily.    [provider]  NIFEdipine (PROCARDIA XL/ADALAT-CC) 90 MG 24 hr tablet Take 90 mg by mouth daily.    [provider]  omeprazole (PRILOSEC) 40 MG capsule Take 40 mg by mouth daily.    [provider]  Oxycodone HCl 10 MG TABS Take 10 mg by mouth 4 (four) times daily as needed.    [provider]  Pancrelipase, Lip-Prot-Amyl, (ZENPEP) 20000 units CPEP Take 1 capsule by mouth 3 (three) times daily.    [provider]  pentoxifylline (TRENTAL) 400 MG CR tablet Take 400 mg by mouth 3 (three) times daily with meals.    [provider]  predniSONE (DELTASONE) 50 MG tablet Take 1 tablet (50 mg total)  by mouth daily. 01/20/17   Lawyer, Harrell Gave, PA-C  sevelamer carbonate (RENVELA) 2.4 g PACK 2.4 g 2 (two) times daily with a meal.    [provider]  sevelamer carbonate (RENVELA) 800 MG tablet Take 800 mg by mouth 3 (three) times daily with meals.    [provider]    Family History No family history on file.  Social History Social History   Tobacco Use  . Smoking status: Former Smoker    Years: 10.00    Types: Cigarettes  . Smokeless tobacco: Never Used  Substance Use Topics  . Alcohol use: No  . Drug use: No     Allergies   Patient has no known allergies.   Review of Systems Review of Systems  Respiratory: Positive for cough and shortness of breath.   All other systems reviewed and are  negative.    Physical Exam Updated Vital Signs BP (!) 120/96   Pulse (!) 38   Temp 97.8 F (36.6 C) (Oral)   Resp (!) 26   Ht 5\' 6"  (1.676 m)   Wt 54.4 kg (120 lb)   SpO2 95%   BMI 19.37 kg/m   Physical Exam  Constitutional: He is oriented to person, place, and time. He appears well-developed.  Slightly tachypneic   HENT:  Head: Normocephalic.  Mouth/Throat: Oropharynx is clear and moist.  Eyes: Pupils are equal, round, and reactive to light.  Neck: Normal range of motion.  Cardiovascular: Normal rate.  Pulmonary/Chest:  Slightly tachypneic, mild diffuse wheezing and diminished breath sounds bilateral bases. Mild retractions   Abdominal: Soft. Bowel sounds are normal.  Musculoskeletal: Normal range of motion.  1+ edema bilateral legs   Neurological: He is alert and oriented to person, place, and time.  Skin: Skin is warm. Capillary refill takes less than 2 seconds.  Psychiatric: He has a normal mood and affect. His behavior is normal.  Nursing note and vitals reviewed.    ED Treatments / Results  Labs (all labs ordered are listed, but only abnormal results are displayed) Labs Reviewed  CBC WITH DIFFERENTIAL/PLATELET - Abnormal; Notable for the following components:      Result Value   Hemoglobin 12.3 (*)    RDW 16.9 (*)    Platelets 86 (*)    All other components within normal limits  COMPREHENSIVE METABOLIC PANEL - Abnormal; Notable for the following components:   Potassium 3.2 (*)    Creatinine, Ser 4.87 (*)    Calcium 7.7 (*)    Albumin 2.6 (*)    GFR calc non Af Amer 10 (*)    GFR calc Af Amer 12 (*)    All other components within normal limits  TROPONIN I - Abnormal; Notable for the following components:   Troponin I 0.08 (*)    All other components within normal limits  BRAIN NATRIURETIC PEPTIDE - Abnormal; Notable for the following components:   B Natriuretic Peptide 2,576.8 (*)    All other components within normal limits  TROPONIN I - Abnormal;  Notable for the following components:   Troponin I 0.08 (*)    All other components within normal limits  DIGOXIN LEVEL - Abnormal; Notable for the following components:   Digoxin Level <0.2 (*)    All other components within normal limits  I-STAT ARTERIAL BLOOD GAS, ED - Abnormal; Notable for the following components:   pH, Arterial 7.300 (*)    pO2, Arterial 49.0 (*)    Acid-base deficit 4.0 (*)  All other components within normal limits    EKG EKG Interpretation  Date/Time:  Monday June 08 2018 08:23:13 EDT Ventricular Rate:  101 PR Interval:    QRS Duration: 91 QT Interval:  384 QTC Calculation: 457 R Axis:   43 Text Interpretation:  Sinus tachycardia Paired ventricular premature complexes Probable left atrial enlargement Low voltage, extremity leads Abnormal T, consider ischemia, lateral leads Baseline wander in lead(s) I III aVL V5 No significant change since last tracing Confirmed by Wandra Arthurs [phone]) on 06/08/2018 8:35:42 AM   Radiology Dg Chest 2 View  Result Date: 06/08/2018 CLINICAL DATA:  Productive cough, shortness of breath. EXAM: CHEST - 2 VIEW COMPARISON:  Radiographs of March 25, 2018. FINDINGS: Stable cardiomegaly. Single lead left-sided pacemaker is unchanged in position. No pneumothorax is noted. Right lung is clear. Mild left basilar atelectasis or infiltrate is noted with minimal associated pleural effusion. Bony thorax is unremarkable. IMPRESSION: Mild left basilar atelectasis or infiltrate is noted with minimal left pleural effusion. Electronically Signed   By: Marijo Conception, M.D.   On: 06/08/2018 09:08    Procedures Procedures (including critical care time)  Medications Ordered in ED Medications  ipratropium-albuterol (DUONEB) 0.5-2.5 (3) MG/3ML nebulizer solution 3 mL (3 mLs Nebulization Given 06/08/18 0839)  albuterol (PROVENTIL) (2.5 MG/3ML) 0.083% nebulizer solution 2.5 mg (2.5 mg Nebulization Given 06/08/18 0839)  methylPREDNISolone sodium  succinate (SOLU-MEDROL) 125 mg/2 mL injection 125 mg (125 mg Intravenous Given 06/08/18 0946)  albuterol (PROVENTIL) (2.5 MG/3ML) 0.083% nebulizer solution 5 mg (5 mg Nebulization Given 06/08/18 1123)  ipratropium (ATROVENT) nebulizer solution 0.5 mg (0.5 mg Nebulization Given 06/08/18 1122)  doxycycline (VIBRA-TABS) tablet 100 mg (100 mg Oral Given 06/08/18 1221)     Initial Impression / Assessment and Plan / ED Course  I have reviewed the triage vital signs and the nursing notes.  Pertinent labs & imaging results that were available during my care of the patient were reviewed by me and considered in my medical decision making (see chart for details).    Ozzy Bohlken is a 84 y.o. male here with SOB, cough. He is on dialysis and last HD was 2 days ago. Consider pulmonary edema vs COPD vs CHF exacerbation. Will get labs, BNP, CXR. Will give nebs, steroids and reassess.   11 am Patient's Trop is 0.08, stable from previous. BNP 2500 and was 3700 previously. CXR showed atelectasis vs infiltrate. WBC nl. Felt better after 1 neb, more wheezing now. Will give second neb. Pulse ox not picking up well so will try ABG as well.   1:12 PM RT attempted ABG but likely got VBG. PH 7.3. Pulse O2 maintaining around 94-95% on RA. Repeat Trop stable at 0.08. Given doxycycline. Will dc home with doxycycline, prednisone. He is slightly fluid overloaded and I told him to avoid drinking too much fluid and go to dialysis tomorrow. Gave strict return precautions.    Final Clinical Impressions(s) / ED Diagnoses   Final diagnoses:  COPD exacerbation ([place])  Community acquired pneumonia of left lower lobe of lung ([place])  ESRD (end stage renal disease) on dialysis ([place])  Elevated troponin  Elevated brain natriuretic peptide (BNP) level    ED Discharge Orders    None       Drenda Freeze, MD 06/08/18 1310    Drenda Freeze, MD 06/08/18 1312

## 2018-06-08 NOTE — Discharge Instructions (Signed)
Take doxycycline twice daily for a week for pneumonia.  Take prednisone as prescribed.   You have some fluid in your lungs. Avoid drinking too much fluid.   Go to dialysis tomorrow.   See your doctor this week   Return to ER if you have worse cough, congestion, trouble breathing, chest pain, fever, worse leg swelling

## 2018-06-08 NOTE — ED Notes (Signed)
IV attempt x2 unsuccessful.  RN aware.  Patient tolerated well. 

## 2018-06-08 NOTE — ED Triage Notes (Signed)
Pt reports productive cough and SOB that started yesterday.

## 2018-11-08 DEATH — deceased

## 2020-04-09 IMAGING — DX DG CHEST 2V
2 series · 2 of 2 positions shown · non-contrast
Comparison: Radiographs March 25, 2018.

CLINICAL DATA: Productive cough, shortness of breath.

EXAM:
CHEST - 2 VIEW

[chest lat]
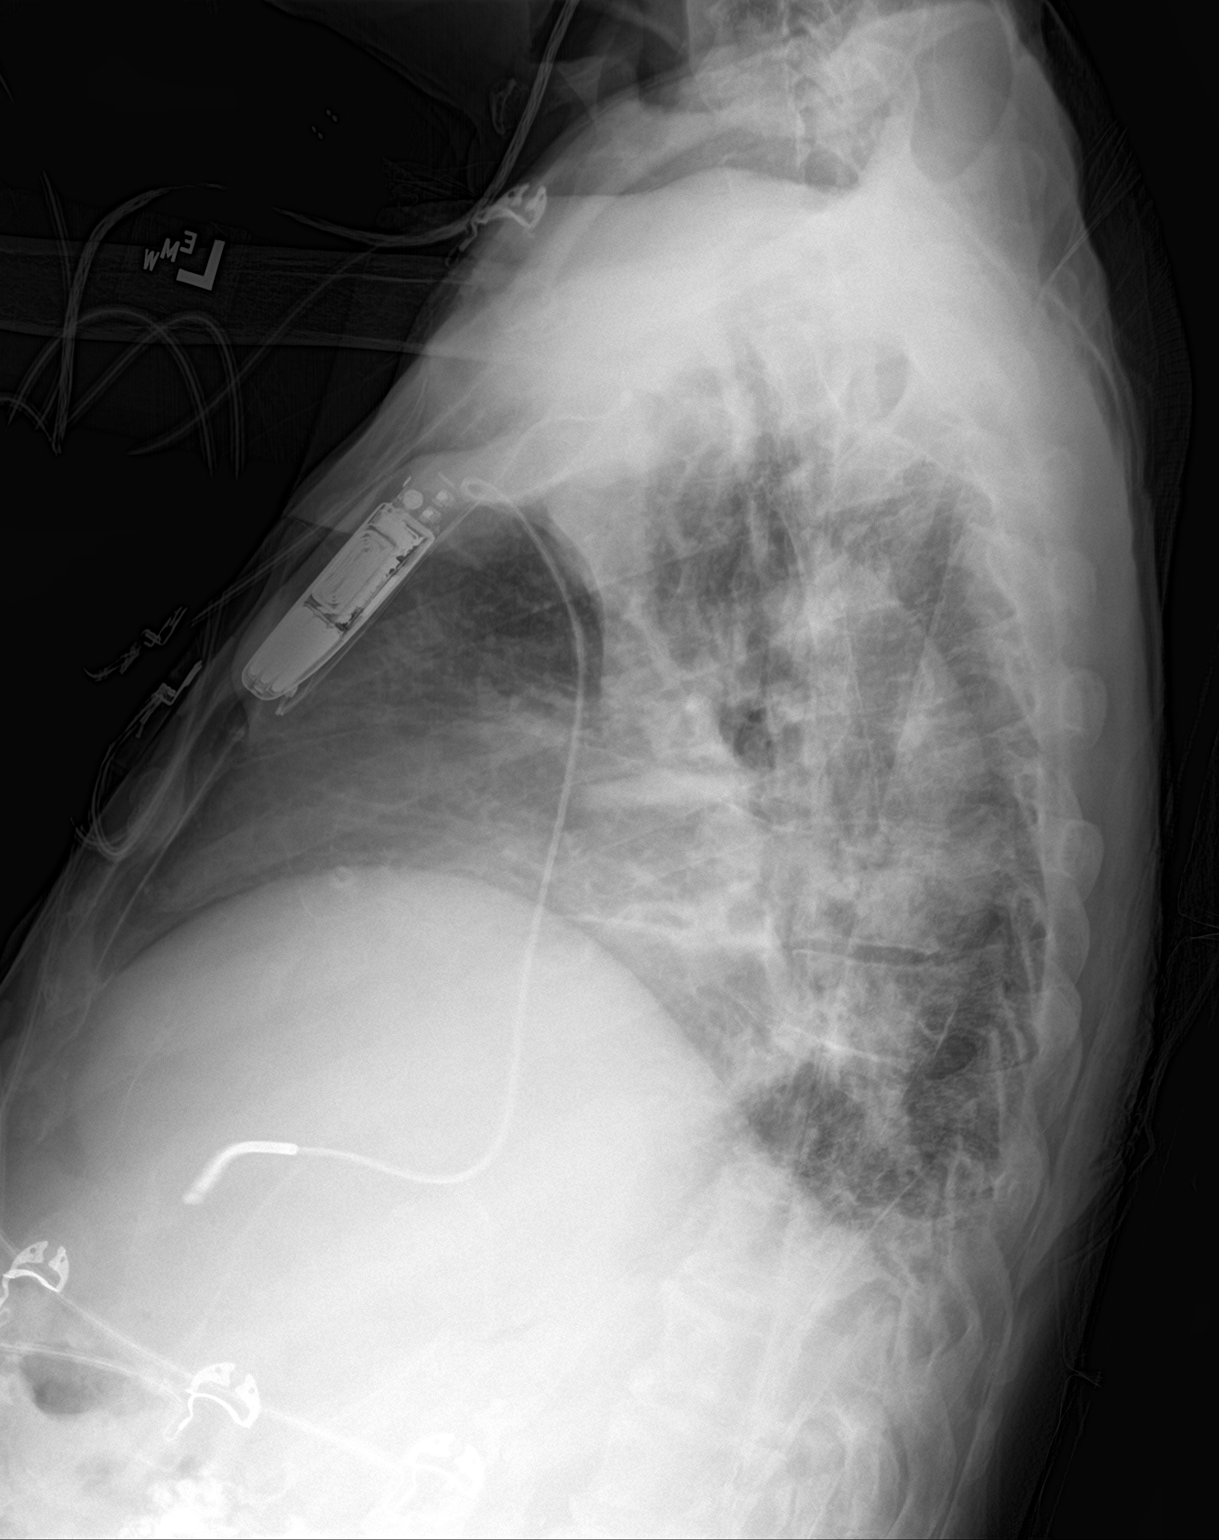

[chest ap strecther]
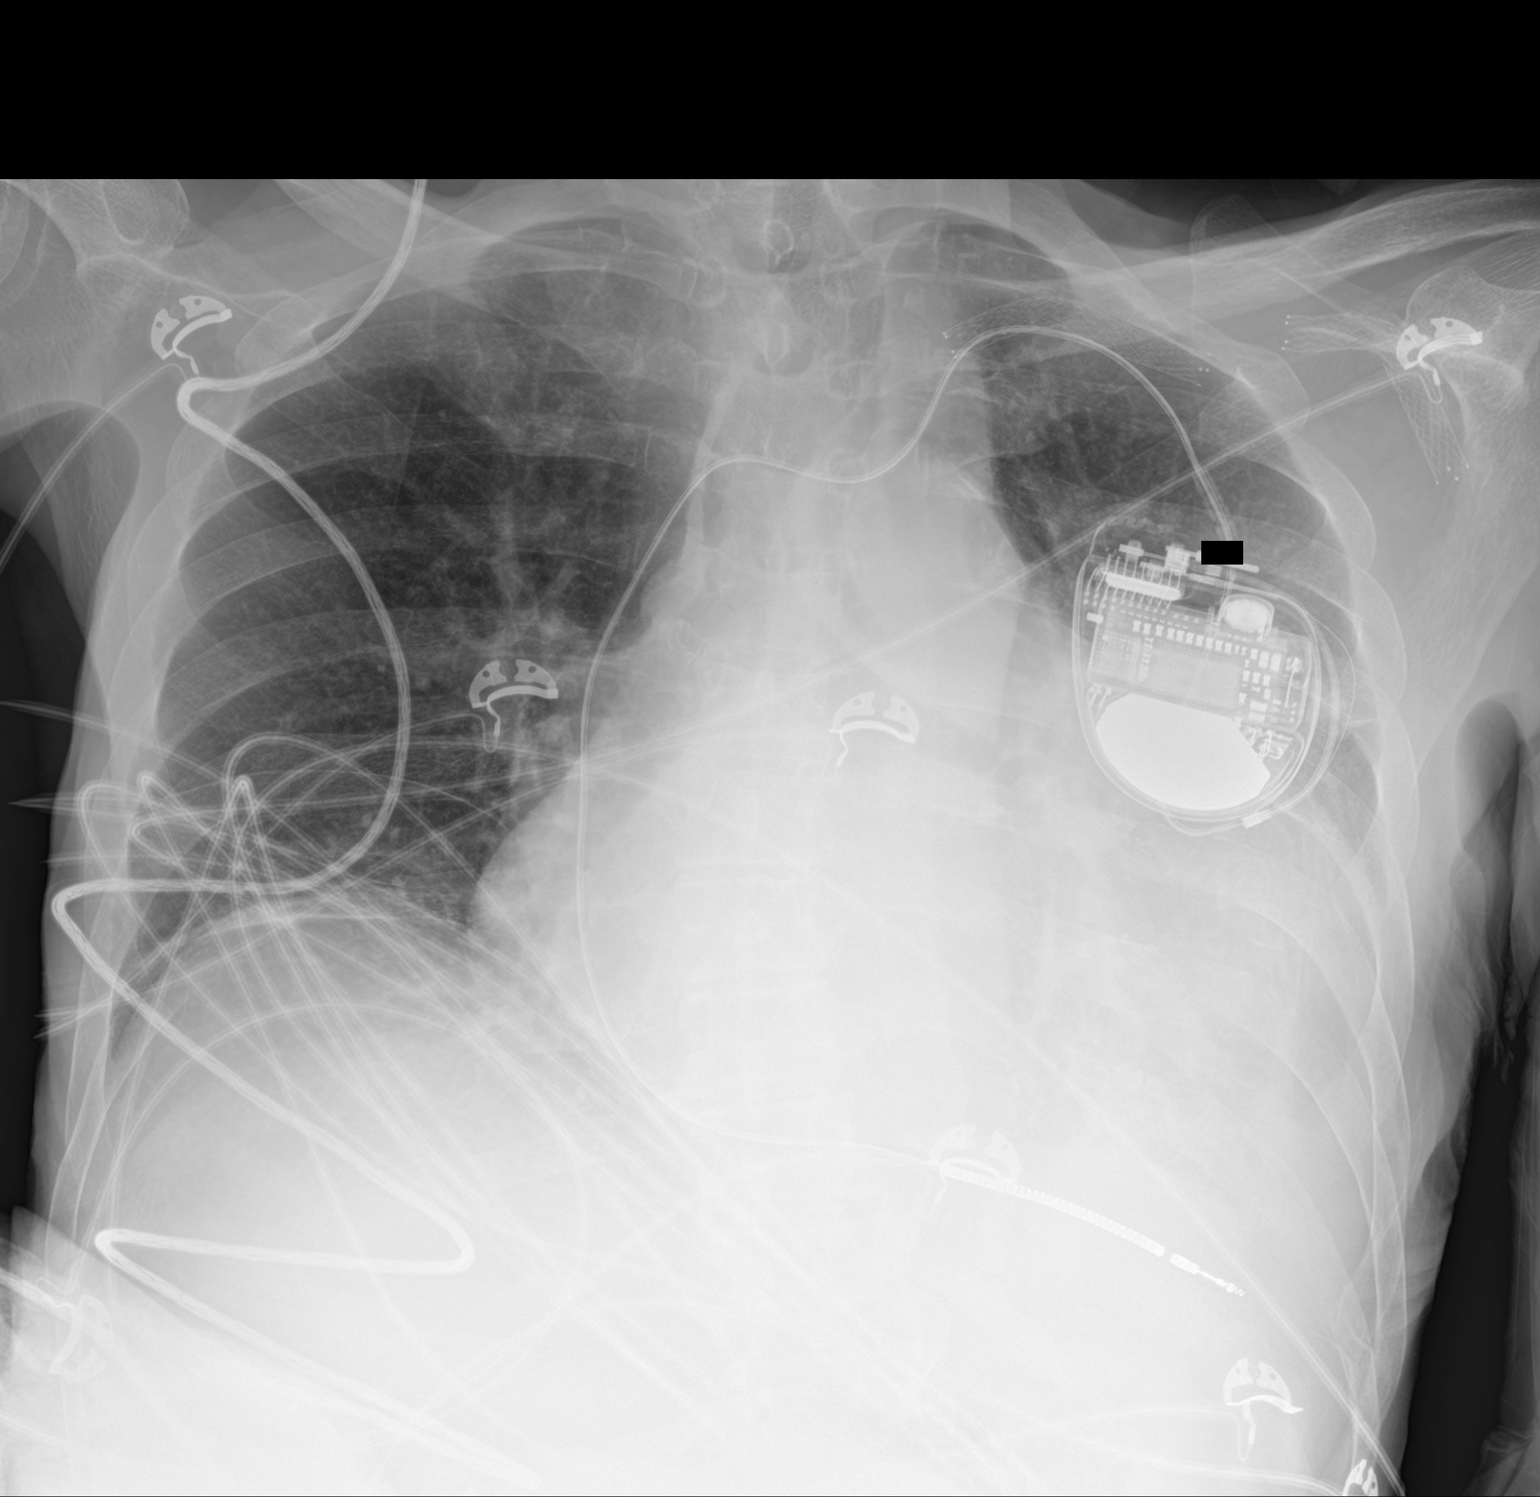

[2 of 2 positions shown; findings below may reference images not displayed]

FINDINGS: Stable cardiomegaly. Single lead left-sided pacemaker is unchanged
in position. No pneumothorax is noted. Right lung is clear. Mild
left basilar atelectasis or infiltrate is noted with minimal
associated pleural effusion. Bony thorax is unremarkable.
IMPRESSION: Mild left basilar atelectasis or infiltrate is noted with minimal
left pleural effusion.
